# Patient Record
Sex: Male | Born: 1960 | Race: White | Hispanic: No | Smoking: Former smoker
Health system: Southern US, Community
[De-identification: ages and names within clinical notes are randomized; demographics above are authoritative.]

## PROBLEM LIST (undated history)

## (undated) DIAGNOSIS — J449 Chronic obstructive pulmonary disease, unspecified: Secondary | ICD-10-CM

## (undated) DIAGNOSIS — E669 Obesity, unspecified: Secondary | ICD-10-CM

## (undated) HISTORY — DX: Chronic obstructive pulmonary disease, unspecified: J44.9

## (undated) HISTORY — DX: Obesity, unspecified: E66.9

## (undated) HISTORY — PX: LEG AMPUTATION ABOVE KNEE: SHX117

---

## 2015-09-26 ENCOUNTER — Other Ambulatory Visit (HOSPITAL_COMMUNITY): Payer: Self-pay | Admitting: Emergency Medicine

## 2015-09-26 DIAGNOSIS — R609 Edema, unspecified: Secondary | ICD-10-CM

## 2015-10-04 ENCOUNTER — Ambulatory Visit (HOSPITAL_COMMUNITY)
Admission: RE | Admit: 2015-10-04 | Discharge: 2015-10-04 | Disposition: A | Discharge status: Home / Self Care | Payer: Medicaid Other | Source: Ambulatory Visit | Attending: Emergency Medicine | Admitting: Emergency Medicine

## 2015-10-04 DIAGNOSIS — R609 Edema, unspecified: Secondary | ICD-10-CM | POA: Insufficient documentation

## 2016-07-02 ENCOUNTER — Encounter (INDEPENDENT_AMBULATORY_CARE_PROVIDER_SITE_OTHER): Payer: Self-pay | Admitting: *Deleted

## 2016-07-12 ENCOUNTER — Encounter (INDEPENDENT_AMBULATORY_CARE_PROVIDER_SITE_OTHER): Payer: Self-pay | Admitting: *Deleted

## 2016-07-13 ENCOUNTER — Other Ambulatory Visit (INDEPENDENT_AMBULATORY_CARE_PROVIDER_SITE_OTHER): Payer: Self-pay | Admitting: *Deleted

## 2016-07-13 DIAGNOSIS — Z1211 Encounter for screening for malignant neoplasm of colon: Secondary | ICD-10-CM | POA: Insufficient documentation

## 2016-09-12 ENCOUNTER — Encounter: Payer: Self-pay | Admitting: Nutrition

## 2016-09-12 ENCOUNTER — Encounter: Payer: Medicaid Other | Attending: Emergency Medicine | Admitting: Nutrition

## 2016-09-12 VITALS — Ht 72.0 in | Wt 274.0 lb

## 2016-09-12 DIAGNOSIS — E782 Mixed hyperlipidemia: Secondary | ICD-10-CM | POA: Insufficient documentation

## 2016-09-12 DIAGNOSIS — Z713 Dietary counseling and surveillance: Secondary | ICD-10-CM | POA: Diagnosis not present

## 2016-09-12 DIAGNOSIS — E7800 Pure hypercholesterolemia, unspecified: Secondary | ICD-10-CM

## 2016-09-12 DIAGNOSIS — E669 Obesity, unspecified: Secondary | ICD-10-CM | POA: Insufficient documentation

## 2016-09-12 DIAGNOSIS — Z6837 Body mass index (BMI) 37.0-37.9, adult: Secondary | ICD-10-CM
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
## 2016-09-12 NOTE — Patient Instructions (Addendum)
Goals 1. Follow Plate Method HIgh Fiber Low Fat Diet 2. Increase fresh fruits and vegetables 3. Walk 30 minutes 7 days a week. 4. Lose 1-2 lbs per week 5. Cut out processed and high fast foods, junk food Get TG, LDL, Cholesterol levels lower

## 2016-10-18 ENCOUNTER — Encounter (INDEPENDENT_AMBULATORY_CARE_PROVIDER_SITE_OTHER): Payer: Self-pay | Admitting: *Deleted

## 2016-10-18 ENCOUNTER — Telehealth (INDEPENDENT_AMBULATORY_CARE_PROVIDER_SITE_OTHER): Payer: Self-pay | Admitting: *Deleted

## 2016-10-18 NOTE — Telephone Encounter (Signed)
Patient needs trilyte 

## 2016-10-19 MED ORDER — PEG 3350-KCL-NA BICARB-NACL 420 G PO SOLR: 4000.0000 mL | Freq: Once | ORAL | 0 refills | Status: AC

## 2016-10-31 ENCOUNTER — Telehealth (INDEPENDENT_AMBULATORY_CARE_PROVIDER_SITE_OTHER): Payer: Self-pay | Admitting: *Deleted

## 2016-10-31 NOTE — Telephone Encounter (Signed)
Referring MD/PCP: kristine price,np -- [facility]   Procedure: tcs  Reason/Indication:  screening  Has patient had this procedure before?  Yes, over 10 yrs ago  If so, when, by whom and where?    Is there a family history of colon cancer?  no  Who?  What age when diagnosed?    Is patient diabetic?   no      Does patient have prosthetic heart valve or mechanical valve?  no  Do you have a pacemaker?  no  Has patient ever had endocarditis? no  Has patient had joint replacement within last 12 months?  no  Does patient tend to be constipated or take laxatives? yes  Does patient have a history of alcohol/drug use?  no  Is patient on Coumadin, Plavix and/or Aspirin? no  Medications: none  Allergies: nkda  Medication Adjustment per Dr Laural Golden:   Procedure date & time: 11/28/16 at 730

## 2016-11-01 NOTE — Telephone Encounter (Signed)
agree

## 2016-11-08 ENCOUNTER — Telehealth: Payer: Self-pay | Admitting: Nutrition

## 2016-11-08 ENCOUNTER — Ambulatory Visit: Payer: Medicaid Other | Admitting: Nutrition

## 2016-11-08 NOTE — Telephone Encounter (Signed)
VM left to call and reschedule missed appt. 

## 2017-02-15 ENCOUNTER — Encounter (INDEPENDENT_AMBULATORY_CARE_PROVIDER_SITE_OTHER): Payer: Self-pay | Admitting: *Deleted

## 2017-02-15 ENCOUNTER — Telehealth (INDEPENDENT_AMBULATORY_CARE_PROVIDER_SITE_OTHER): Payer: Self-pay | Admitting: *Deleted

## 2017-02-15 MED ORDER — PEG-KCL-NACL-NASULF-NA ASC-C 140 G PO SOLR: 1.0000 | Freq: Once | ORAL | 0 refills | Status: AC

## 2017-02-15 NOTE — Telephone Encounter (Signed)
Patient needs plenvu 

## 2017-02-25 ENCOUNTER — Telehealth (INDEPENDENT_AMBULATORY_CARE_PROVIDER_SITE_OTHER): Payer: Self-pay | Admitting: *Deleted

## 2017-02-25 NOTE — Telephone Encounter (Signed)
Referring MD/PCP: kristine price,np -- [facility]   Procedure: tcs  Reason/Indication:  screening  Has patient had this procedure before?  Yes, over 10 yrs ago             If so, when, by whom and where?    Is there a family history of colon cancer?  no             Who?  What age when diagnosed?    Is patient diabetic?   no                                                  Does patient have prosthetic heart valve or mechanical valve?  no  Do you have a pacemaker?  no  Has patient ever had endocarditis? no  Has patient had joint replacement within last 12 months?  no  Does patient tend to be constipated or take laxatives? yes  Does patient have a history of alcohol/drug use?  no  Is patient on Coumadin, Plavix and/or Aspirin? no  Medications: none  Allergies: nkda  Medication Adjustment per Dr Laural Golden:   Procedure date & time: 03/27/17 at 11:15

## 2017-02-25 NOTE — Telephone Encounter (Signed)
agree

## 2017-03-27 ENCOUNTER — Ambulatory Visit (HOSPITAL_COMMUNITY)
Admission: RE | Admit: 2017-03-27 | Discharge: 2017-03-27 | Disposition: A | Discharge status: Home / Self Care | Payer: Medicare Other | Source: Ambulatory Visit | Attending: Internal Medicine | Admitting: Internal Medicine

## 2017-03-27 ENCOUNTER — Encounter (HOSPITAL_COMMUNITY): Payer: Self-pay | Admitting: *Deleted

## 2017-03-27 ENCOUNTER — Encounter (HOSPITAL_COMMUNITY)
Admission: RE | Disposition: A | Discharge status: Home / Self Care | Payer: Self-pay | Source: Ambulatory Visit | Attending: Internal Medicine

## 2017-03-27 ENCOUNTER — Other Ambulatory Visit: Payer: Self-pay

## 2017-03-27 DIAGNOSIS — D125 Benign neoplasm of sigmoid colon: Secondary | ICD-10-CM

## 2017-03-27 DIAGNOSIS — Z1211 Encounter for screening for malignant neoplasm of colon: Secondary | ICD-10-CM | POA: Diagnosis present

## 2017-03-27 DIAGNOSIS — D124 Benign neoplasm of descending colon: Secondary | ICD-10-CM | POA: Insufficient documentation

## 2017-03-27 DIAGNOSIS — Z6836 Body mass index (BMI) 36.0-36.9, adult: Secondary | ICD-10-CM | POA: Insufficient documentation

## 2017-03-27 DIAGNOSIS — K644 Residual hemorrhoidal skin tags: Secondary | ICD-10-CM | POA: Diagnosis not present

## 2017-03-27 DIAGNOSIS — D122 Benign neoplasm of ascending colon: Secondary | ICD-10-CM | POA: Diagnosis not present

## 2017-03-27 DIAGNOSIS — E669 Obesity, unspecified: Secondary | ICD-10-CM | POA: Insufficient documentation

## 2017-03-27 DIAGNOSIS — F17210 Nicotine dependence, cigarettes, uncomplicated: Secondary | ICD-10-CM | POA: Diagnosis not present

## 2017-03-27 DIAGNOSIS — Z89611 Acquired absence of right leg above knee: Secondary | ICD-10-CM | POA: Diagnosis not present

## 2017-03-27 DIAGNOSIS — D123 Benign neoplasm of transverse colon: Secondary | ICD-10-CM

## 2017-03-27 DIAGNOSIS — K5730 Diverticulosis of large intestine without perforation or abscess without bleeding: Secondary | ICD-10-CM | POA: Diagnosis not present

## 2017-03-27 HISTORY — PX: COLONOSCOPY: SHX5424

## 2017-03-27 HISTORY — PX: POLYPECTOMY: SHX5525

## 2017-03-27 SURGERY — COLONOSCOPY
Anesthesia: Moderate Sedation

## 2017-03-27 MED ORDER — MEPERIDINE HCL 50 MG/ML IJ SOLN
INTRAMUSCULAR | Status: AC
  Filled 2017-03-27: qty 1

## 2017-03-27 MED ORDER — MIDAZOLAM HCL 5 MG/5ML IJ SOLN
INTRAMUSCULAR | Status: DC | PRN
  Administered 2017-03-27 (×6): 2 mg via INTRAVENOUS

## 2017-03-27 MED ORDER — MIDAZOLAM HCL 5 MG/5ML IJ SOLN
INTRAMUSCULAR | Status: AC
  Filled 2017-03-27: qty 10

## 2017-03-27 MED ORDER — MEPERIDINE HCL 50 MG/ML IJ SOLN
INTRAMUSCULAR | Status: DC | PRN
  Administered 2017-03-27 (×2): 25 mg via INTRAVENOUS

## 2017-03-27 MED ORDER — STERILE WATER FOR IRRIGATION IR SOLN
Status: DC | PRN
  Administered 2017-03-27: 11:00:00

## 2017-03-27 MED ORDER — SODIUM CHLORIDE 0.9 % IV SOLN
INTRAVENOUS | Status: DC
  Administered 2017-03-27: 1000 mL via INTRAVENOUS

## 2017-03-27 MED ORDER — MIDAZOLAM HCL 5 MG/5ML IJ SOLN
INTRAMUSCULAR | Status: AC
  Filled 2017-03-27: qty 5

## 2017-03-27 NOTE — H&P (Signed)
Shawn Mccarthy is an 57 y.o. male.   Chief Complaint: Patient is here for colonoscopy. HPI: Patient is 57 year old Caucasian male who is here for screening colonoscopy.  This is patient's first exam.  He denies abdominal pain change in bowel habits or rectal bleeding. Family history is negative for CRC.  Past Medical History:  Diagnosis Date  . Obesity (BMI 30-39.9)     Past Surgical History:  Procedure Laterality Date  . LEG AMPUTATION ABOVE KNEE Right    2004    Family History  Problem Relation Age of Onset  . Cancer Father    Social History:  reports that he has been smoking cigarettes.  he has never used smokeless tobacco. He reports that he does not drink alcohol or use drugs.  Allergies: No Known Allergies  Medications Prior to Admission  Medication Sig Dispense Refill  . Multiple Vitamins-Minerals (MULTIVITAMIN WITH MINERALS) tablet Take 1 tablet by mouth daily.      No results found for this or any previous visit (from the past 48 hour(s)). No results found.  ROS  Blood pressure 131/77, pulse 75, temperature (!) 97.4 F (36.3 C), temperature source Oral, resp. rate 13, height 6' (1.829 m), weight 267 lb (121.1 kg), SpO2 (!) 2 %. Physical Exam  Constitutional: He appears well-developed and well-nourished.  HENT:  Mouth/Throat: Oropharynx is clear and moist.  Eyes: Conjunctivae are normal. No scleral icterus.  Neck: No thyromegaly present.  Cardiovascular: Normal rate, regular rhythm and normal heart sounds.  No murmur heard. Respiratory: Effort normal and breath sounds normal.  GI: Soft. He exhibits no distension and no mass. There is no tenderness.  Musculoskeletal: He exhibits no edema.  Lymphadenopathy:    He has no cervical adenopathy.  Neurological: He is alert.  Skin: Skin is warm and dry.     Assessment/Plan Average risk screening colonoscopy.  Hildred Laser, MD 03/27/2017, 10:58 AM

## 2017-03-27 NOTE — Discharge Instructions (Signed)
No aspirin or NSAIDs for 1 week. Resume usual medications as before. High-fiber diet. No driving for 24 hours. Physician will call with biopsy results.   Colonoscopy, Adult, Care After This sheet gives you information about how to care for yourself after your procedure. Your doctor may also give you more specific instructions. If you have problems or questions, call your doctor. Follow these instructions at home: General instructions   For the first 24 hours after the procedure: ? Do not drive or use machinery. ? Do not sign important documents. ? Do not drink alcohol. ? Do your daily activities more slowly than normal. ? Eat foods that are soft and easy to digest. ? Rest often.  Take over-the-counter or prescription medicines only as told by your doctor.  It is up to you to get the results of your procedure. Ask your doctor, or the department performing the procedure, when your results will be ready. To help cramping and bloating:  Try walking around.  Put heat on your belly (abdomen) as told by your doctor. Use a heat source that your doctor recommends, such as a moist heat pack or a heating pad. ? Put a towel between your skin and the heat source. ? Leave the heat on for 20-30 minutes. ? Remove the heat if your skin turns bright red. This is especially important if you cannot feel pain, heat, or cold. You can get burned. Eating and drinking  Drink enough fluid to keep your pee (urine) clear or pale yellow.  Return to your normal diet as told by your doctor. Avoid heavy or fried foods that are hard to digest.  Avoid drinking alcohol for as long as told by your doctor. Contact a doctor if:  You have blood in your poop (stool) 2-3 days after the procedure. Get help right away if:  You have more than a small amount of blood in your poop.  You see large clumps of tissue (blood clots) in your poop.  Your belly is swollen.  You feel sick to your stomach  (nauseous).  You throw up (vomit).  You have a fever.  You have belly pain that gets worse, and medicine does not help your pain. This information is not intended to replace advice given to you by your health care provider. Make sure you discuss any questions you have with your health care provider. Document Released: 03/03/2010 Document Revised: 10/24/2015 Document Reviewed: 10/24/2015 Elsevier Interactive Patient Education  2017 [employer]  Hemorrhoids Hemorrhoids are swollen veins in and around the rectum or anus. There are two types of hemorrhoids:  Internal hemorrhoids. These occur in the veins that are just inside the rectum. They may poke through to the outside and become irritated and painful.  External hemorrhoids. These occur in the veins that are outside of the anus and can be felt as a painful swelling or hard lump near the anus.  Most hemorrhoids do not cause serious problems, and they can be managed with home treatments such as diet and lifestyle changes. If home treatments do not help your symptoms, procedures can be done to shrink or remove the hemorrhoids. What are the causes? This condition is caused by increased pressure in the anal area. This pressure may result from various things, including:  Constipation.  Straining to have a bowel movement.  Diarrhea.  Pregnancy.  Obesity.  Sitting for long periods of time.  Heavy lifting or other activity that causes you to strain.  Anal sex.  What are  the signs or symptoms? Symptoms of this condition include:  Pain.  Anal itching or irritation.  Rectal bleeding.  Leakage of stool (feces).  Anal swelling.  One or more lumps around the anus.  How is this diagnosed? This condition can often be diagnosed through a visual exam. Other exams or tests may also be done, such as:  Examination of the rectal area with a gloved hand (digital rectal exam).  Examination of the anal canal using a small tube  (anoscope).  A blood test, if you have lost a significant amount of blood.  A test to look inside the colon (sigmoidoscopy or colonoscopy).  How is this treated? This condition can usually be treated at home. However, various procedures may be done if dietary changes, lifestyle changes, and other home treatments do not help your symptoms. These procedures can help make the hemorrhoids smaller or remove them completely. Some of these procedures involve surgery, and others do not. Common procedures include:  Rubber band ligation. Rubber bands are placed at the base of the hemorrhoids to cut off the blood supply to them.  Sclerotherapy. Medicine is injected into the hemorrhoids to shrink them.  Infrared coagulation. A type of light energy is used to get rid of the hemorrhoids.  Hemorrhoidectomy surgery. The hemorrhoids are surgically removed, and the veins that supply them are tied off.  Stapled hemorrhoidopexy surgery. A circular stapling device is used to remove the hemorrhoids and use staples to cut off the blood supply to them.  Follow these instructions at home: Eating and drinking  Eat foods that have a lot of fiber in them, such as whole grains, beans, nuts, fruits, and vegetables. Ask your health care provider about taking products that have added fiber (fiber supplements).  Drink enough fluid to keep your urine clear or pale yellow. Managing pain and swelling  Take warm sitz baths for 20 minutes, 3-4 times a day to ease pain and discomfort.  If directed, apply ice to the affected area. Using ice packs between sitz baths may be helpful. ? Put ice in a plastic bag. ? Place a towel between your skin and the bag. ? Leave the ice on for 20 minutes, 2-3 times a day. General instructions  Take over-the-counter and prescription medicines only as told by your health care provider.  Use medicated creams or suppositories as told.  Exercise regularly.  Go to the bathroom when you  have the urge to have a bowel movement. Do not wait.  Avoid straining to have bowel movements.  Keep the anal area dry and clean. Use wet toilet paper or moist towelettes after a bowel movement.  Do not sit on the toilet for long periods of time. This increases blood pooling and pain. Contact a health care provider if:  You have increasing pain and swelling that are not controlled by treatment or medicine.  You have uncontrolled bleeding.  You have difficulty having a bowel movement, or you are unable to have a bowel movement.  You have pain or inflammation outside the area of the hemorrhoids. This information is not intended to replace advice given to you by your health care provider. Make sure you discuss any questions you have with your health care provider. Document Released: 01/27/2000 Document Revised: 06/29/2015 Document Reviewed: 10/13/2014 Elsevier Interactive Patient Education  2018 [employer].  Diverticulosis Diverticulosis is a condition that develops when small pouches (diverticula) form in the wall of the large intestine (colon). The colon is where water is absorbed  and stool is formed. The pouches form when the inside layer of the colon pushes through weak spots in the outer layers of the colon. You may have a few pouches or many of them. What are the causes? The cause of this condition is not known. What increases the risk? The following factors may make you more likely to develop this condition:  Being older than age 87. Your risk for this condition increases with age. Diverticulosis is rare among people younger than age 17. By age 59, many people have it.  Eating a low-fiber diet.  Having frequent constipation.  Being overweight.  Not getting enough exercise.  Smoking.  Taking over-the-counter pain medicines, like aspirin and ibuprofen.  Having a family history of diverticulosis.  What are the signs or symptoms? In most people, there are no symptoms of  this condition. If you do have symptoms, they may include:  Bloating.  Cramps in the abdomen.  Constipation or diarrhea.  Pain in the lower left side of the abdomen.  How is this diagnosed? This condition is most often diagnosed during an exam for other colon problems. Because diverticulosis usually has no symptoms, it often cannot be diagnosed independently. This condition may be diagnosed by:  Using a flexible scope to examine the colon (colonoscopy).  Taking an X-ray of the colon after dye has been put into the colon (barium enema).  Doing a CT scan.  How is this treated? You may not need treatment for this condition if you have never developed an infection related to diverticulosis. If you have had an infection before, treatment may include:  Eating a high-fiber diet. This may include eating more fruits, vegetables, and grains.  Taking a fiber supplement.  Taking a live bacteria supplement (probiotic).  Taking medicine to relax your colon.  Taking antibiotic medicines.  Follow these instructions at home:  Drink 6-8 glasses of water or more each day to prevent constipation.  Try not to strain when you have a bowel movement.  If you have had an infection before: ? Eat more fiber as directed by your health care provider or your diet and nutrition specialist (dietitian). ? Take a fiber supplement or probiotic, if your health care provider approves.  Take over-the-counter and prescription medicines only as told by your health care provider.  If you were prescribed an antibiotic, take it as told by your health care provider. Do not stop taking the antibiotic even if you start to feel better.  Keep all follow-up visits as told by your health care provider. This is important. Contact a health care provider if:  You have pain in your abdomen.  You have bloating.  You have cramps.  You have not had a bowel movement in 3 days. Get help right away if:  Your pain gets  worse.  Your bloating becomes very bad.  You have a fever or chills, and your symptoms suddenly get worse.  You vomit.  You have bowel movements that are bloody or black.  You have bleeding from your rectum. Summary  Diverticulosis is a condition that develops when small pouches (diverticula) form in the wall of the large intestine (colon).  You may have a few pouches or many of them.  This condition is most often diagnosed during an exam for other colon problems.  If you have had an infection related to diverticulosis, treatment may include increasing the fiber in your diet, taking supplements, or taking medicines. This information is not intended to replace advice  given to you by your health care provider. Make sure you discuss any questions you have with your health care provider. Document Released: 10/27/2003 Document Revised: 12/19/2015 Document Reviewed: 12/19/2015 Elsevier Interactive Patient Education  2017 [place].  Colon Polyps Polyps are tissue growths inside the body. Polyps can grow in many places, including the large intestine (colon). A polyp may be a round bump or a mushroom-shaped growth. You could have one polyp or several. Most colon polyps are noncancerous (benign). However, some colon polyps can become cancerous over time. What are the causes? The exact cause of colon polyps is not known. What increases the risk? This condition is more likely to develop in people who:  Have a family history of colon cancer or colon polyps.  Are older than 64 or older than 45 if they are African American.  Have inflammatory bowel disease, such as ulcerative colitis or Crohn disease.  Are overweight.  Smoke cigarettes.  Do not get enough exercise.  Drink too much alcohol.  Eat a diet that is: ? High in fat and red meat. ? Low in fiber.  Had childhood cancer that was treated with abdominal radiation.  What are the signs or symptoms? Most polyps do not cause  symptoms. If you have symptoms, they may include:  Blood coming from your rectum when having a bowel movement.  Blood in your stool.The stool may look dark red or black.  A change in bowel habits, such as constipation or diarrhea.  How is this diagnosed? This condition is diagnosed with a colonoscopy. This is a procedure that uses a lighted, flexible scope to look at the inside of your colon. How is this treated? Treatment for this condition involves removing any polyps that are found. Those polyps will then be tested for cancer. If cancer is found, your health care provider will talk to you about options for colon cancer treatment. Follow these instructions at home: Diet  Eat plenty of fiber, such as fruits, vegetables, and whole grains.  Eat foods that are high in calcium and vitamin D, such as milk, cheese, yogurt, eggs, liver, fish, and broccoli.  Limit foods high in fat, red meats, and processed meats, such as hot dogs, sausage, bacon, and lunch meats.  Maintain a healthy weight, or lose weight if recommended by your health care provider. General instructions  Do not smoke cigarettes.  Do not drink alcohol excessively.  Keep all follow-up visits as told by your health care provider. This is important. This includes keeping regularly scheduled colonoscopies. Talk to your health care provider about when you need a colonoscopy.  Exercise every day or as told by your health care provider. Contact a health care provider if:  You have new or worsening bleeding during a bowel movement.  You have new or increased blood in your stool.  You have a change in bowel habits.  You unexpectedly lose weight. This information is not intended to replace advice given to you by your health care provider. Make sure you discuss any questions you have with your health care provider. Document Released: 10/26/2003 Document Revised: 07/07/2015 Document Reviewed: 12/20/2014 Elsevier Interactive  Patient Education  [employer].

## 2017-03-27 NOTE — Op Note (Signed)
[facility] Patient Name: Shawn Mccarthy Procedure Date: 03/27/2017 10:57 AM MRN: [id number] Date of Birth: 1960/03/17 Attending MD: Hildred Laser , MD CSN: [id number] Age: 57 Admit Type: Outpatient Procedure:                Colonoscopy Indications:              Screening for colorectal malignant neoplasm Providers:                Hildred Laser, MD, Otis Peak B. Sharon Seller, RN, Nelma Rothman, Technician Referring MD:             Aris Everts, NP Medicines:                Meperidine 50 mg IV, Midazolam 12 mg IV Complications:            No immediate complications. Estimated Blood Loss:     Estimated blood loss was minimal. Procedure:                Pre-Anesthesia Assessment:                           - Prior to the procedure, a History and Physical                            was performed, and patient medications and                            allergies were reviewed. The patient's tolerance of                            previous anesthesia was also reviewed. The risks                            and benefits of the procedure and the sedation                            options and risks were discussed with the patient.                            All questions were answered, and informed consent                            was obtained. Prior Anticoagulants: The patient                            last took naproxen 7 days prior to the procedure.                            ASA Grade Assessment: I - A normal, healthy                            patient. After reviewing the risks and benefits,  the patient was deemed in satisfactory condition to                            undergo the procedure.                           After obtaining informed consent, the colonoscope                            was passed under direct vision. Throughout the                            procedure, the patient's blood pressure, pulse, and       oxygen saturations were monitored continuously. The                            EC-[id number] ([id number]) scope was introduced through                            the anus and advanced to the the cecum, identified                            by appendiceal orifice and ileocecal valve. The                            colonoscopy was performed without difficulty. The                            patient tolerated the procedure well. The quality                            of the bowel preparation was excellent. The                            ileocecal valve, appendiceal orifice, and rectum                            were photographed. Scope In: 11:08:04 AM Scope Out: 11:46:33 AM Scope Withdrawal Time: 0 hours 28 minutes 59 seconds  Total Procedure Duration: 0 hours 38 minutes 29 seconds  Findings:      The perianal and digital rectal examinations were normal.      Two sessile polyps were found in the sigmoid colon and ascending colon.       The polyps were small in size. Coagulation for tissue destruction using       monopolar probe was successful.      Three sessile polyps were found in the sigmoid colon and transverse       colon. The polyps were 5 to 8 mm in size. These polyps were removed with       a hot snare. Resection and retrieval were complete. The pathology       specimen was placed into Bottle Number 1.      A small polyp was found in the descending colon. The polyp was sessile.       The polyp was removed with a cold snare.  Resection and retrieval were       complete. The pathology specimen was placed into Bottle Number 1.      A 20 mm polyp was found in the mid sigmoid colon. The polyp was       multi-lobulated and pedunculated. The polyp was removed with a hot       snare. Resection and retrieval were complete. The pathology specimen was       placed into Bottle Number 2. To prevent bleeding after the polypectomy,       one hemostatic clip was successfully placed (MR conditional).  There was       no bleeding during, or at the end, of the procedure.      Scattered small and large-mouthed diverticula were found in the sigmoid       colon.      External hemorrhoids were found during retroflexion. The hemorrhoids       were small. Impression:               - Two small polyps in the sigmoid colon and in the                            ascending colon. Treated with a monopolar probe.                           - Three 5 to 8 mm polyps in the sigmoid colon and                            in the transverse colon, removed with a hot snare.                            Resected and retrieved.                           - One small polyp in the descending colon, removed                            with a cold snare. Resected and retrieved.                           - One 20 mm polyp in the mid sigmoid colon, removed                            with a hot snare. Resected and retrieved. Clip (MR                            conditional) was placed.                           - Diverticulosis in the sigmoid colon.                           - External hemorrhoids. Moderate Sedation:      Moderate (conscious) sedation was administered by the endoscopy nurse       and supervised by the endoscopist. The following parameters were       monitored: oxygen  saturation, heart rate, blood pressure, CO2       capnography and response to care. Total physician intraservice time was       46 minutes. Recommendation:           - Patient has a contact number available for                            emergencies. The signs and symptoms of potential                            delayed complications were discussed with the                            patient. Return to normal activities tomorrow.                            Written discharge instructions were provided to the                            patient.                           - High fiber diet today.                           - Continue present  medications.                           - No aspirin, ibuprofen, naproxen, or other                            non-steroidal anti-inflammatory drugs for 7 days                            after polyp removal.                           - Await pathology results.                           - Repeat colonoscopy for surveillance based on                            pathology results. Procedure Code(s):        --- Professional ---                           [phone], Colonoscopy, flexible; with ablation of                            tumor(s), polyp(s), or other lesion(s) (includes                            pre- and post-dilation and guide wire passage, when  performed)                           45385, 59, Colonoscopy, flexible; with removal of                            tumor(s), polyp(s), or other lesion(s) by snare                            technique                           99152, Moderate sedation services provided by the                            same physician or other qualified health care                            professional performing the diagnostic or                            therapeutic service that the sedation supports,                            requiring the presence of an independent trained                            observer to assist in the monitoring of the                            patient's level of consciousness and physiological                            status; initial 15 minutes of intraservice time,                            patient age 71 years or older                           [phone], Moderate sedation services; each additional                            15 minutes intraservice time                           99153, Moderate sedation services; each additional                            15 minutes intraservice time Diagnosis Code(s):        --- Professional ---                           Z12.11, Encounter for screening for malignant                             neoplasm of colon  D12.2, Benign neoplasm of ascending colon                           D12.5, Benign neoplasm of sigmoid colon                           D12.3, Benign neoplasm of transverse colon (hepatic                            flexure or splenic flexure)                           D12.4, Benign neoplasm of descending colon                           K64.4, Residual hemorrhoidal skin tags                           K57.30, Diverticulosis of large intestine without                            perforation or abscess without bleeding CPT copyright 2016 [employer]. All rights reserved. The codes documented in this report are preliminary and upon coder review may  be revised to meet current compliance requirements. Hildred Laser, MD Hildred Laser, MD 03/27/2017 11:59:13 AM This report has been signed electronically. Number of Addenda: 0

## 2017-03-29 ENCOUNTER — Encounter (HOSPITAL_COMMUNITY): Payer: Self-pay | Admitting: Internal Medicine

## 2018-03-01 ENCOUNTER — Emergency Department (HOSPITAL_COMMUNITY): Payer: Medicare Other

## 2018-03-01 ENCOUNTER — Emergency Department (HOSPITAL_COMMUNITY)
Admission: EM | Admit: 2018-03-01 | Discharge: 2018-03-01 | Disposition: A | Discharge status: Home / Self Care | Payer: Medicare Other | Attending: Emergency Medicine | Admitting: Emergency Medicine

## 2018-03-01 ENCOUNTER — Other Ambulatory Visit: Payer: Self-pay

## 2018-03-01 DIAGNOSIS — R51 Headache: Secondary | ICD-10-CM | POA: Diagnosis present

## 2018-03-01 DIAGNOSIS — Z79899 Other long term (current) drug therapy: Secondary | ICD-10-CM | POA: Insufficient documentation

## 2018-03-01 DIAGNOSIS — F17210 Nicotine dependence, cigarettes, uncomplicated: Secondary | ICD-10-CM | POA: Insufficient documentation

## 2018-03-01 DIAGNOSIS — R519 Headache, unspecified: Secondary | ICD-10-CM

## 2018-03-01 MED ORDER — BUTALBITAL-APAP-CAFFEINE 50-325-40 MG PO TABS: 1.0000 | ORAL_TABLET | Freq: Four times a day (QID) | ORAL | 0 refills | Status: AC | PRN

## 2018-03-01 MED ORDER — PROCHLORPERAZINE EDISYLATE 10 MG/2ML IJ SOLN
10.0000 mg | Freq: Once | INTRAMUSCULAR | Status: AC
Start: 2018-03-01 — End: 2018-03-01
  Administered 2018-03-01: 10 mg via INTRAVENOUS
  Filled 2018-03-01: qty 2

## 2018-03-01 MED ORDER — DEXAMETHASONE SODIUM PHOSPHATE 10 MG/ML IJ SOLN
10.0000 mg | Freq: Once | INTRAMUSCULAR | Status: AC
  Administered 2018-03-01: 10 mg via INTRAVENOUS
  Filled 2018-03-01: qty 1

## 2018-03-01 MED ORDER — KETOROLAC TROMETHAMINE 30 MG/ML IJ SOLN
30.0000 mg | Freq: Once | INTRAMUSCULAR | Status: AC
  Administered 2018-03-01: 30 mg via INTRAVENOUS
  Filled 2018-03-01: qty 1

## 2018-03-01 MED ORDER — NAPROXEN 500 MG PO TABS: 500.0000 mg | ORAL_TABLET | Freq: Two times a day (BID) | ORAL | 0 refills | Status: AC

## 2018-03-01 NOTE — ED Triage Notes (Signed)
Headache for 2 weeks.  Pain from right posterior head around to right eye.  Rates pain 4/10, nagging.
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
## 2018-03-01 NOTE — Discharge Instructions (Signed)
Your CT scan showed no signs of tumors, aneurysms or strokes, we have treated your headache with a combination of medications which usually helps to relieve your initial symptoms however you may continue to have intermittent headaches throughout the week.  If you should develop severe or worsening symptoms please seek medical exam immediately.  I have given you the phone number for the neurologist, please follow-up this week for a recheck.

## 2018-04-24 ENCOUNTER — Other Ambulatory Visit (HOSPITAL_COMMUNITY): Payer: Self-pay | Admitting: Family Medicine

## 2018-04-24 DIAGNOSIS — Z87891 Personal history of nicotine dependence: Secondary | ICD-10-CM

## 2018-04-24 DIAGNOSIS — Z720 Tobacco use: Secondary | ICD-10-CM

## 2018-04-28 ENCOUNTER — Other Ambulatory Visit (HOSPITAL_COMMUNITY): Payer: Self-pay | Admitting: Family Medicine

## 2018-04-28 ENCOUNTER — Other Ambulatory Visit: Payer: Self-pay | Admitting: Family Medicine

## 2018-04-28 DIAGNOSIS — R740 Nonspecific elevation of levels of transaminase and lactic acid dehydrogenase [LDH]: Principal | ICD-10-CM

## 2018-04-28 DIAGNOSIS — R7401 Elevation of levels of liver transaminase levels: Secondary | ICD-10-CM

## 2018-06-06 ENCOUNTER — Ambulatory Visit (HOSPITAL_COMMUNITY): Payer: Medicare Other

## 2018-06-10 ENCOUNTER — Ambulatory Visit (HOSPITAL_COMMUNITY): Admission: RE | Admit: 2018-06-10 | Payer: Medicare Other | Source: Ambulatory Visit

## 2018-06-25 ENCOUNTER — Other Ambulatory Visit: Payer: Self-pay

## 2018-06-25 ENCOUNTER — Ambulatory Visit (HOSPITAL_COMMUNITY)
Admission: RE | Admit: 2018-06-25 | Discharge: 2018-06-25 | Disposition: A | Discharge status: Home / Self Care | Payer: Medicare Other | Source: Ambulatory Visit | Attending: Family Medicine | Admitting: Family Medicine

## 2018-06-25 DIAGNOSIS — R740 Nonspecific elevation of levels of transaminase and lactic acid dehydrogenase [LDH]: Secondary | ICD-10-CM | POA: Diagnosis not present

## 2018-06-25 DIAGNOSIS — Z87891 Personal history of nicotine dependence: Secondary | ICD-10-CM | POA: Diagnosis present

## 2018-06-25 DIAGNOSIS — R7401 Elevation of levels of liver transaminase levels: Secondary | ICD-10-CM

## 2019-08-06 ENCOUNTER — Emergency Department (HOSPITAL_COMMUNITY): Payer: Medicare Other

## 2019-08-06 ENCOUNTER — Emergency Department (HOSPITAL_COMMUNITY)
Admission: EM | Admit: 2019-08-06 | Discharge: 2019-08-06 | Discharge status: Against Medical Advice | Payer: Medicare Other | Attending: Emergency Medicine | Admitting: Emergency Medicine

## 2019-08-06 ENCOUNTER — Encounter (HOSPITAL_COMMUNITY): Payer: Self-pay

## 2019-08-06 ENCOUNTER — Other Ambulatory Visit: Payer: Self-pay

## 2019-08-06 DIAGNOSIS — M542 Cervicalgia: Secondary | ICD-10-CM | POA: Diagnosis present

## 2019-08-06 DIAGNOSIS — J449 Chronic obstructive pulmonary disease, unspecified: Secondary | ICD-10-CM | POA: Diagnosis not present

## 2019-08-06 DIAGNOSIS — Z87891 Personal history of nicotine dependence: Secondary | ICD-10-CM | POA: Insufficient documentation

## 2019-08-06 DIAGNOSIS — Z89611 Acquired absence of right leg above knee: Secondary | ICD-10-CM | POA: Diagnosis not present

## 2019-08-06 NOTE — ED Provider Notes (Signed)
[facility] EMERGENCY DEPARTMENT Provider Note   CSN: [id number] Arrival date & time: 08/06/19  3300     History Chief Complaint  Patient presents with  . Headache    Shawn Mccarthy is a 59 y.o. male.  59 year old male presents with complaint of pain that occurs at his left mastoid process radiates towards his jaw and then down towards his left lateral neck and out above his left clavicle.  Patient states pain is intermittent, lasts for few hours when it occurs, has been ongoing for the past month.  No relief with stretching, massage, taking OTC medications including Tylenol, Advil, Aleve.  Pain is not worse with movement or massage.  Patient reports sudden onset of pain when it does occur, states last episode was yesterday while sitting on the sofa.  He denies any associated nausea, vomiting, chest pain or shortness of breath, visual disturbance.  Patient was seen in another emergency room 1 month ago with a similar complaint, had a CT of his head that was unremarkable, had reported dizziness at that time and was treated for vertigo with meclizine.  Patient states that he does not experience dizziness since that episode.  Had a similar event a few months ago occurring on the right side with pain to the right mastoid and down into the neck, states he has not experienced right-sided pain since that time.  Patient has not followed up with his PCP for this complaint and does not have any pain at this time.        Past Medical History:  Diagnosis Date  . COPD (chronic obstructive pulmonary disease) ([place])   . Obesity (BMI 30-39.9)     Patient Active Problem List   Diagnosis Date Noted  . Obesity 09/12/2016  . Special screening for malignant neoplasms, colon 07/13/2016    Past Surgical History:  Procedure Laterality Date  . COLONOSCOPY N/A 03/27/2017   Procedure: COLONOSCOPY;  Surgeon: Rogene Houston, MD;  Location: [facility];  Service: Endoscopy;  Laterality: N/A;   730-rescheduled to 11/16 @ 8:30am per Lelon Frohlich  . LEG AMPUTATION ABOVE KNEE Right    2004  . POLYPECTOMY  03/27/2017   Procedure: POLYPECTOMY;  Surgeon: Rogene Houston, MD;  Location: [facility] SUITE;  Service: Endoscopy;;  colon       Family History  Problem Relation Age of Onset  . Cancer Father     Social History   Tobacco Use  . Smoking status: Former Smoker    Types: Cigarettes  . Smokeless tobacco: Current User  Vaping Use  . Vaping Use: Never used  Substance Use Topics  . Alcohol use: No  . Drug use: No    Home Medications Prior to Admission medications   Medication Sig Start Date End Date Taking? Authorizing Provider  acetaminophen (TYLENOL) 500 MG tablet Take 1,000 mg by mouth every 6 (six) hours as needed for headache.    [provider]  Multiple Vitamins-Minerals (MULTIVITAMIN WITH MINERALS) tablet Take 1 tablet by mouth daily.    [provider]  naproxen (NAPROSYN) 500 MG tablet Take 1 tablet (500 mg total) by mouth 2 (two) times daily with a meal. 03/01/18   Noemi Chapel, MD    Allergies    Patient has no known allergies.  Review of Systems   Review of Systems  Constitutional: Negative for fever.  Eyes: Negative for visual disturbance.  Respiratory: Negative for chest tightness and shortness of breath.   Cardiovascular: Negative for chest pain.  Gastrointestinal: Negative for nausea and vomiting.  Musculoskeletal: Positive for neck pain. Negative for back pain.  Skin: Negative for color change, rash and wound.  Neurological: Positive for headaches. Negative for dizziness, speech difficulty, weakness and numbness.  Hematological: Negative for adenopathy.  Psychiatric/Behavioral: Negative for confusion.  All other systems reviewed and are negative.   Physical Exam Updated Vital Signs BP 120/77 (BP Location: Right Arm)   Pulse 63   Temp 97.9 F (36.6 C) (Oral)   Resp 18   Ht 6' (1.829 m)   Wt 119.7 kg   SpO2 95%   BMI 35.80 kg/m     Physical Exam Vitals and nursing note reviewed.  Constitutional:      General: He is not in acute distress.    Appearance: He is well-developed. He is not diaphoretic.  HENT:     Head: Normocephalic and atraumatic.     Mouth/Throat:     Mouth: Mucous membranes are moist.  Eyes:     Extraocular Movements: Extraocular movements intact.     Pupils: Pupils are equal, round, and reactive to light.  Pulmonary:     Effort: Pulmonary effort is normal.  Musculoskeletal:     Cervical back: Normal range of motion and neck supple.  Skin:    General: Skin is warm and dry.     Findings: No rash.  Neurological:     Mental Status: He is alert and oriented to person, place, and time.     GCS: GCS eye subscore is 4. GCS verbal subscore is 5. GCS motor subscore is 6.     Cranial Nerves: No cranial nerve deficit or facial asymmetry.     Motor: No weakness.  Psychiatric:        Behavior: Behavior normal.     ED Results / Procedures / Treatments   Labs (all labs ordered are listed, but only abnormal results are displayed) Labs Reviewed - No data to display  EKG None  Radiology No results found.  Procedures Procedures (including critical care time)  Medications Ordered in ED Medications - No data to display  ED Course  I have reviewed the triage vital signs and the nursing notes.  Pertinent labs & imaging results that were available during my care of the patient were reviewed by me and considered in my medical decision making (see chart for details).  Clinical Course as of Aug 06 1626  Thu Aug 06, 1342  4755 59 year old male with complaint of pain in the left mastoid to left side of neck for the past month.  Patient is here today without any pain however states this has been ongoing for the past month.  On exam, there is no tenderness over the temples, no tenderness to either mastoid, no tenderness with palpation along the left SCM or left trapezius.  Discussed with Dr. Eulis Foster, ER  attending, plan is to CT C-spine, consider possible cervical radiculopathy.  CT from [facility] ER visit 1 month ago reviewed, shows age-related changes.   [LM]  1628 Patient left the department without notifying staff he was leaving. CT not obtained prior to leaving.   [LM]    Clinical Course User Index [LM] Roque Lias   MDM Rules/Calculators/A&P                          Final Clinical Impression(s) / ED Diagnoses Final diagnoses:  Neck pain    Rx / DC Orders ED Discharge Orders  None       Roque Lias 08/06/19 1628    Daleen Bo, MD 08/06/19 [id number]

## 2019-08-06 NOTE — ED Notes (Signed)
Pt not in room or BR, unable to locate, not in CT, assume pt left AMA

## 2019-08-06 NOTE — ED Triage Notes (Signed)
Pt reports pain behind left ear radiating down neck and at times into his left jaw.  Denies any cp or sob.  Reports has had similar pain behind r ear off and on but worse on left side.  Denies injury.  Says was evaluated in an ED in [place] 2 weeks ago and was told he had motion sickness.

## 2020-02-22 ENCOUNTER — Encounter (INDEPENDENT_AMBULATORY_CARE_PROVIDER_SITE_OTHER): Payer: Self-pay | Admitting: *Deleted

## 2020-08-11 ENCOUNTER — Encounter (INDEPENDENT_AMBULATORY_CARE_PROVIDER_SITE_OTHER): Payer: Self-pay | Admitting: Internal Medicine

## 2020-09-10 IMAGING — CT CT CHEST LUNG CANCER SCREENING LOW DOSE
2 of 4 series · 15 of 40 positions shown, 18 images · non-contrast
Comparison: None.

CLINICAL DATA: 57-year-old male with 44 pack-year history of
smoking.

EXAM:
CT CHEST WITHOUT CONTRAST LOW-DOSE FOR LUNG CANCER SCREENING
TECHNIQUE: Multidetector CT imaging of the chest was performed following the
standard protocol without IV contrast.

[Series 2: axial st · axial · 0.74mm/px · z∈[+1240,+1495]mm · 12 of 61 slices shown, 15 images]
[im 5/61  mediastinal]
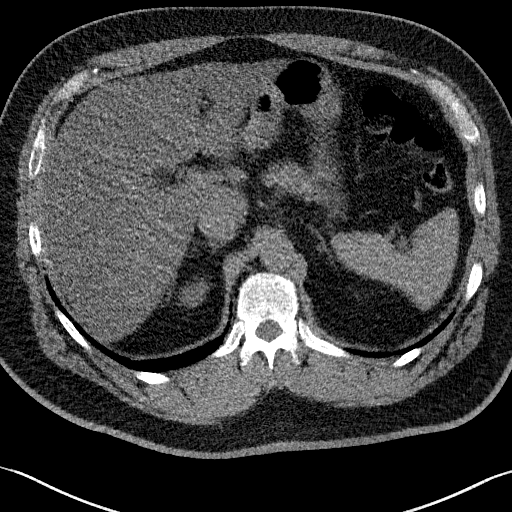
[im 5/61  lung]
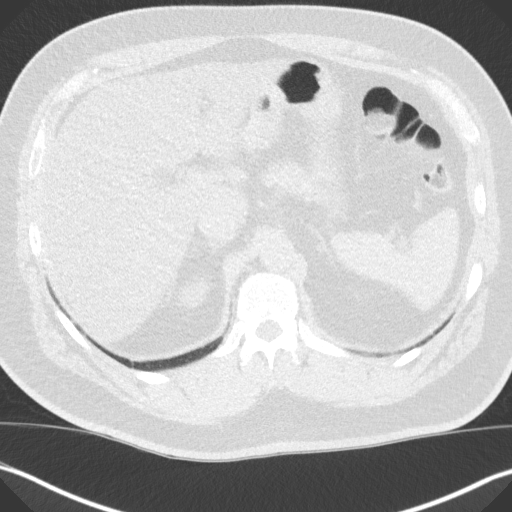
[im 10/61  lung]
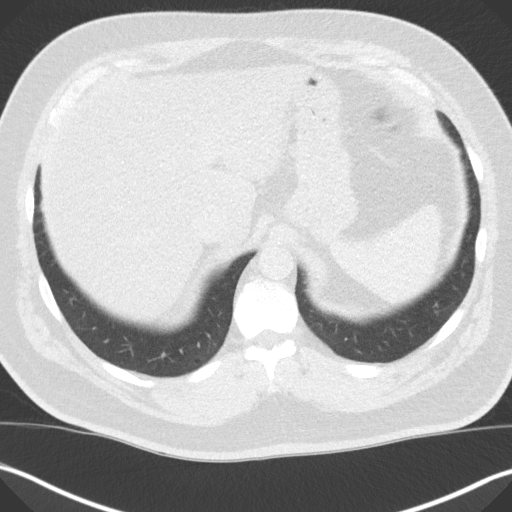
[im 14/61  lung]
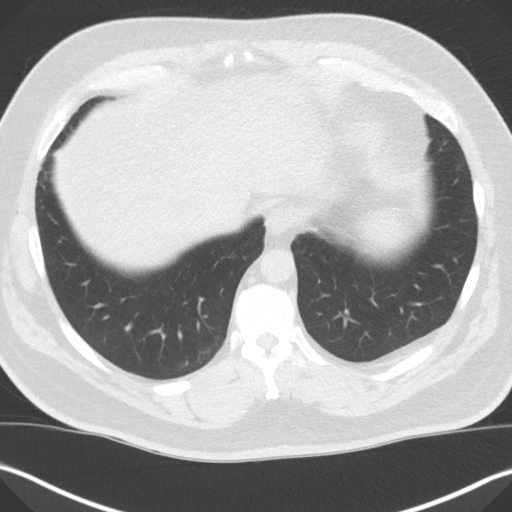
[im 19/61  lung]
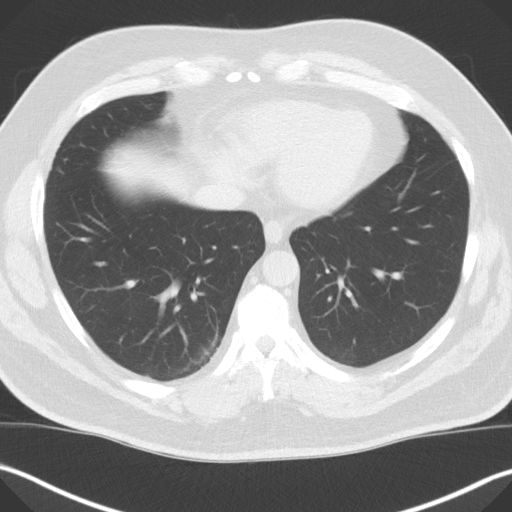
[im 24/61  mediastinal]
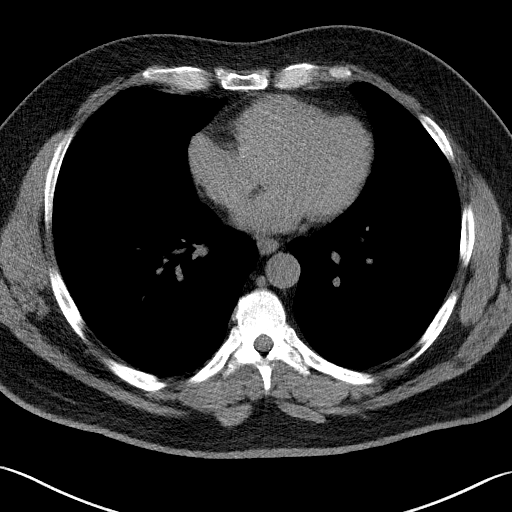
[im 24/61  lung]
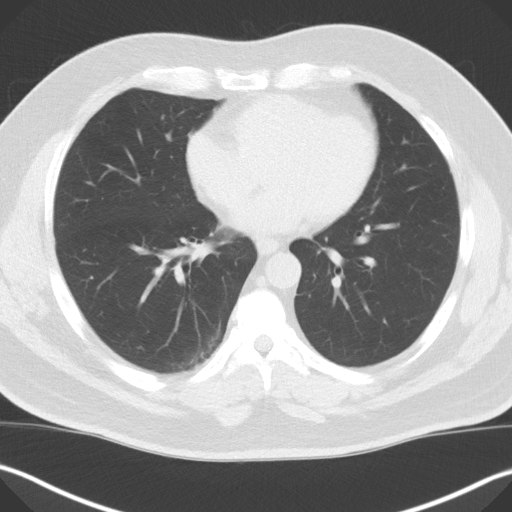
[im 28/61  lung]
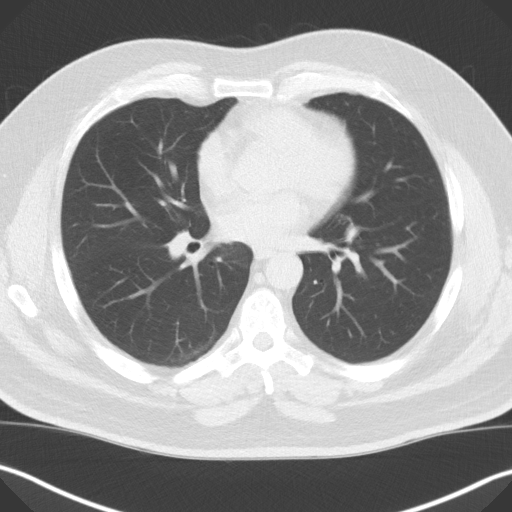
[im 33/61  lung]
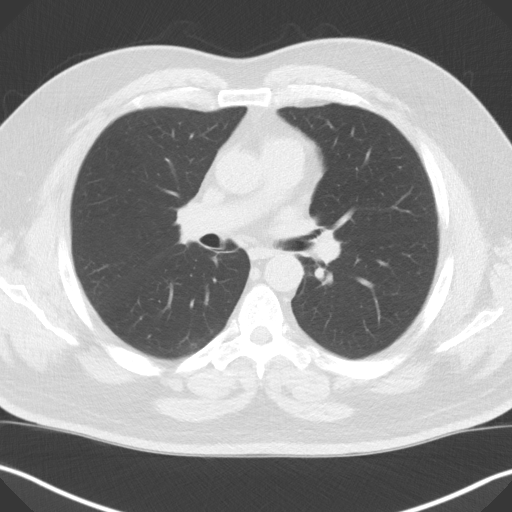
[im 37/61  lung]
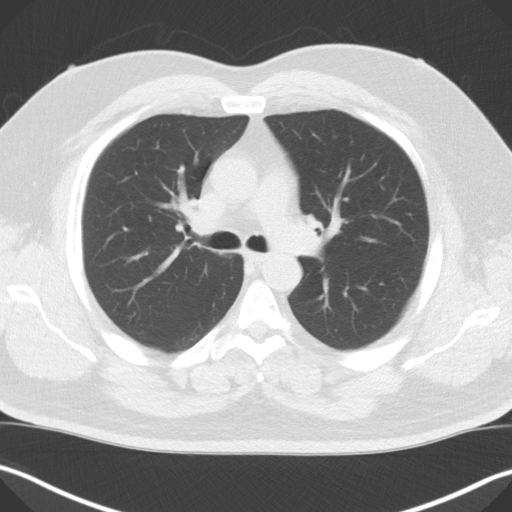
[im 42/61  mediastinal]
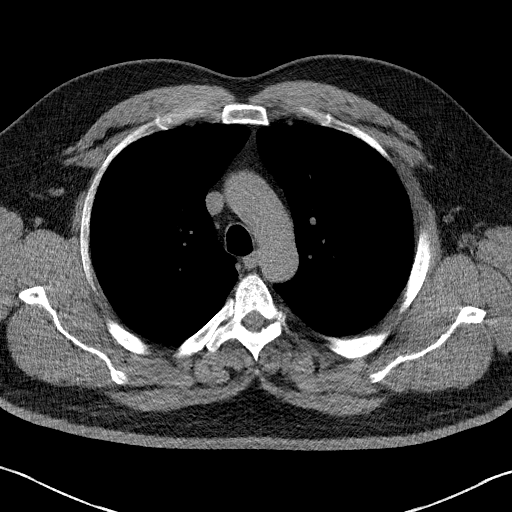
[im 42/61  lung]
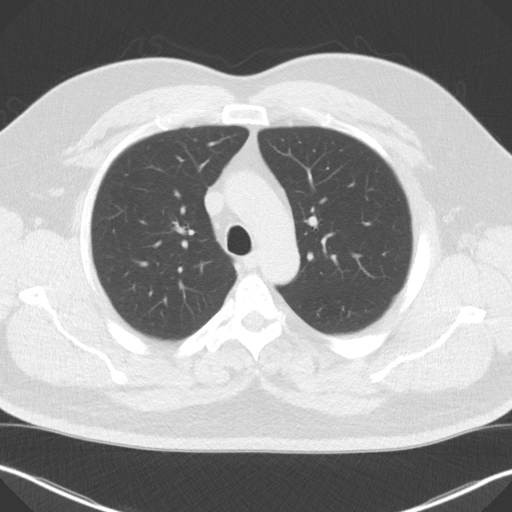
[im 47/61  lung]
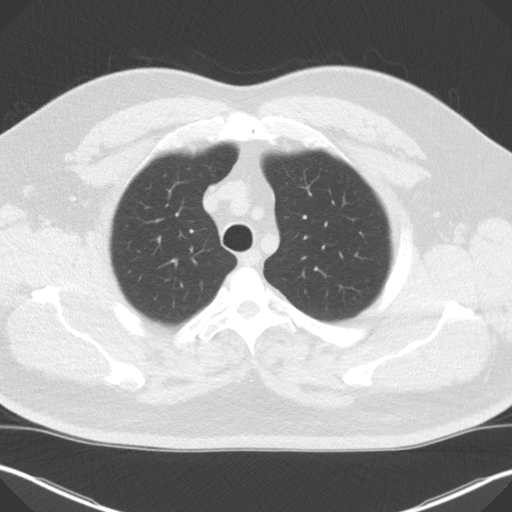
[im 51/61  lung]
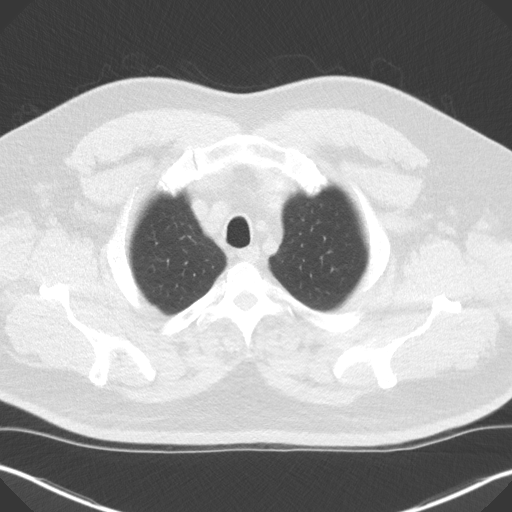
[im 56/61  lung]
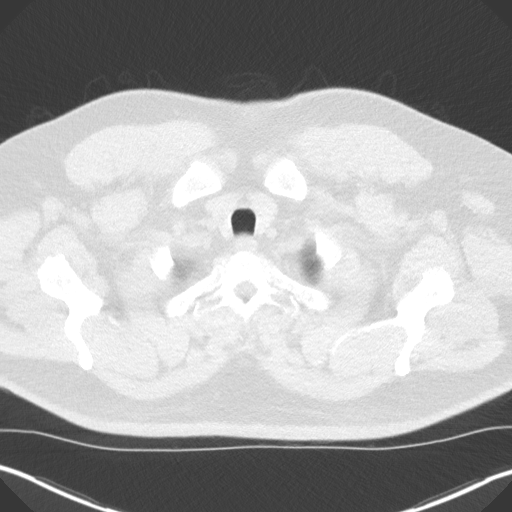

[Series 5: coronal · coronal · 0.64mm/px · 3 of 301 slices shown]
[im 61/301  lung]
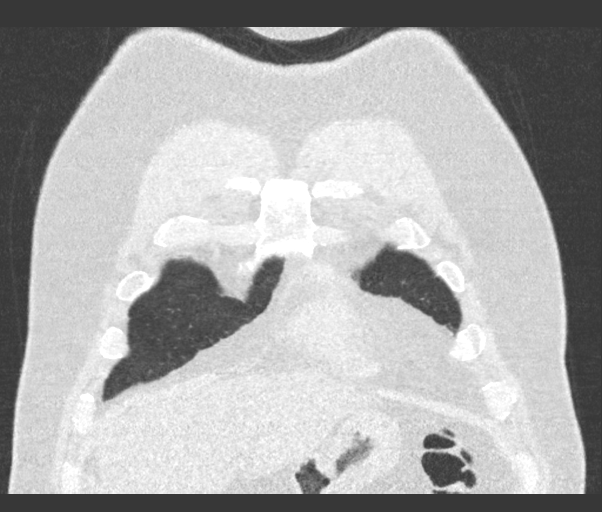
[im 121/301  lung]
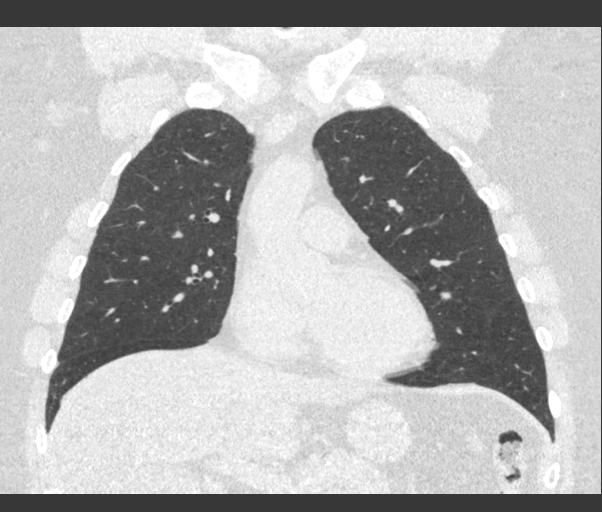
[im 181/301  lung]
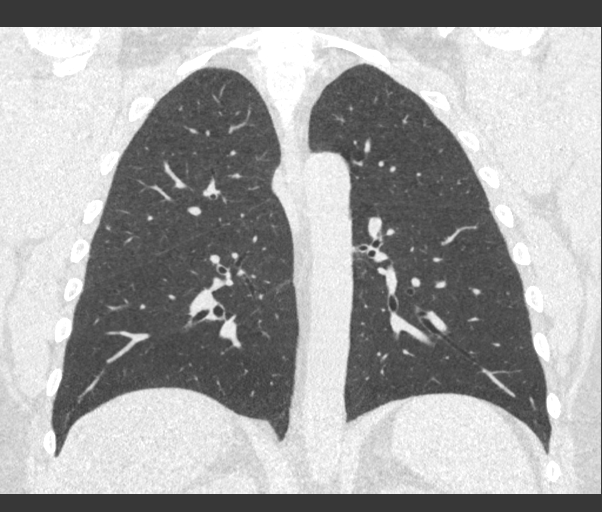

[15 of 40 positions shown; findings below may reference images not displayed]

FINDINGS: Cardiovascular: The heart size is normal. No substantial pericardial
effusion. No thoracic aortic aneurysm.

Mediastinum/Nodes: No mediastinal lymphadenopathy. No evidence for
gross hilar lymphadenopathy although assessment is limited by the
lack of intravenous contrast on today's study. The esophagus has
normal imaging features. There is no axillary lymphadenopathy.

Lungs/Pleura: The central tracheobronchial airways are patent.
Subtle changes of centrilobular emphsyema noted. No suspicious
pulmonary nodule or mass. Tiny perifissural nodule noted left lung.
No focal airspace consolidation. No pleural effusion.

Upper Abdomen: The liver shows diffusely decreased attenuation
suggesting steatosis.

Musculoskeletal: No worrisome lytic or sclerotic osseous
abnormality.
IMPRESSION: 1. Lung-RADS 2, benign appearance or behavior. Continue annual
screening with low-dose chest CT without contrast in 12 months.
2.  Emphysema. ([id number])

## 2020-11-30 ENCOUNTER — Telehealth (INDEPENDENT_AMBULATORY_CARE_PROVIDER_SITE_OTHER): Payer: Self-pay

## 2020-11-30 ENCOUNTER — Other Ambulatory Visit (INDEPENDENT_AMBULATORY_CARE_PROVIDER_SITE_OTHER): Payer: Self-pay

## 2020-11-30 ENCOUNTER — Encounter (INDEPENDENT_AMBULATORY_CARE_PROVIDER_SITE_OTHER): Payer: Self-pay

## 2020-11-30 DIAGNOSIS — Z86010 Personal history of colonic polyps: Secondary | ICD-10-CM

## 2020-11-30 DIAGNOSIS — E1051 Type 1 diabetes mellitus with diabetic peripheral angiopathy without gangrene: Secondary | ICD-10-CM

## 2020-11-30 MED ORDER — PEG 3350-KCL-NA BICARB-NACL 420 G PO SOLR: 4000.0000 mL | ORAL | 0 refills | Status: AC

## 2020-11-30 NOTE — Telephone Encounter (Signed)
Shawn Mccarthy, CMA  

## 2020-11-30 NOTE — Telephone Encounter (Signed)
Referring MD/PCP: Zhou-Talbert  Procedure: Tcs   Reason/Indication:  Hx of polyps  Has patient had this procedure before?  yes  If so, when, by whom and where?  03/2017  Is there a family history of colon cancer?  no  Who?  What age when diagnosed?    Is patient diabetic? If yes, Type 1 or Type 2   yes, type 1       Does patient have prosthetic heart valve or mechanical valve?  no  Do you have a pacemaker/defibrillator?  no  Has patient ever had endocarditis/atrial fibrillation? no  Does patient use oxygen? no  Has patient had joint replacement within last 12 months?  no  Is patient constipated or do they take laxatives? yes  Does patient have a history of alcohol/drug use?  no  Have you had a stroke/heart attack last 6 mths? no  Do you take medicine for weight loss?  no  For male patients,: do you still have your menstrual cycle? N/A  Is patient on blood thinner such as Coumadin, Plavix and/or Aspirin? no  Medications: Metformin 500 mg bid, Atorvastating 40 mg daily  Allergies: nkda  Medication Adjustment per Dr Laural Golden Hold Metformin the evening prior and the morning of your procedure  Procedure date & time: Thursday 12/15/20 10:30 am

## 2020-12-15 ENCOUNTER — Encounter (HOSPITAL_COMMUNITY): Admission: RE | Payer: Self-pay | Source: Home / Self Care

## 2020-12-15 ENCOUNTER — Ambulatory Visit (HOSPITAL_COMMUNITY): Admission: RE | Admit: 2020-12-15 | Payer: Medicare Other | Source: Home / Self Care | Admitting: Internal Medicine

## 2020-12-15 SURGERY — COLONOSCOPY
Anesthesia: Moderate Sedation

## 2021-02-20 ENCOUNTER — Encounter (INDEPENDENT_AMBULATORY_CARE_PROVIDER_SITE_OTHER): Payer: Self-pay | Admitting: *Deleted

## 2021-07-26 ENCOUNTER — Other Ambulatory Visit (HOSPITAL_COMMUNITY): Payer: Self-pay | Admitting: Family Medicine

## 2021-07-26 ENCOUNTER — Other Ambulatory Visit: Payer: Self-pay | Admitting: Family Medicine

## 2021-07-26 DIAGNOSIS — Z87891 Personal history of nicotine dependence: Secondary | ICD-10-CM

## 2021-08-17 ENCOUNTER — Encounter (INDEPENDENT_AMBULATORY_CARE_PROVIDER_SITE_OTHER): Payer: Self-pay | Admitting: *Deleted

## 2021-09-01 ENCOUNTER — Ambulatory Visit (HOSPITAL_COMMUNITY)
Admission: RE | Admit: 2021-09-01 | Discharge: 2021-09-01 | Disposition: A | Discharge status: Home / Self Care | Payer: Medicare Other | Source: Ambulatory Visit | Attending: Family Medicine | Admitting: Family Medicine

## 2021-09-01 DIAGNOSIS — Z87891 Personal history of nicotine dependence: Secondary | ICD-10-CM | POA: Insufficient documentation

## 2022-07-02 ENCOUNTER — Encounter (INDEPENDENT_AMBULATORY_CARE_PROVIDER_SITE_OTHER): Payer: Self-pay | Admitting: *Deleted

## 2022-12-17 ENCOUNTER — Encounter (INDEPENDENT_AMBULATORY_CARE_PROVIDER_SITE_OTHER): Payer: Self-pay | Admitting: *Deleted

## 2023-05-08 ENCOUNTER — Other Ambulatory Visit (HOSPITAL_COMMUNITY): Payer: Self-pay | Admitting: Family Medicine

## 2023-05-08 DIAGNOSIS — Z87891 Personal history of nicotine dependence: Secondary | ICD-10-CM

## 2023-08-26 ENCOUNTER — Ambulatory Visit (HOSPITAL_COMMUNITY)

## 2023-09-25 ENCOUNTER — Ambulatory Visit (HOSPITAL_COMMUNITY)
Admission: RE | Admit: 2023-09-25 | Discharge: 2023-09-25 | Disposition: A | Discharge status: Home / Self Care | Source: Ambulatory Visit | Attending: Family Medicine | Admitting: Family Medicine

## 2023-09-25 DIAGNOSIS — Z87891 Personal history of nicotine dependence: Secondary | ICD-10-CM | POA: Diagnosis present
